# Patient Record
Sex: Female | Born: 1980 | Race: Black or African American | Hispanic: No | Marital: Single | Smoking: Never smoker
Health system: Southern US, Community
[De-identification: ages and names within clinical notes are randomized; demographics above are authoritative.]

---

## 2014-01-21 ENCOUNTER — Encounter (HOSPITAL_COMMUNITY): Payer: Self-pay | Admitting: Emergency Medicine

## 2014-01-21 DIAGNOSIS — T4275XA Adverse effect of unspecified antiepileptic and sedative-hypnotic drugs, initial encounter: Secondary | ICD-10-CM | POA: Diagnosis not present

## 2014-01-21 DIAGNOSIS — K8000 Calculus of gallbladder with acute cholecystitis without obstruction: Principal | ICD-10-CM | POA: Diagnosis present

## 2014-01-21 DIAGNOSIS — K8020 Calculus of gallbladder without cholecystitis without obstruction: Secondary | ICD-10-CM | POA: Diagnosis not present

## 2014-01-21 DIAGNOSIS — K8010 Calculus of gallbladder with chronic cholecystitis without obstruction: Secondary | ICD-10-CM | POA: Diagnosis present

## 2014-01-21 NOTE — ED Notes (Signed)
Pt. reports mid abdominal pain onset this evening , denies nausea or vomitting , no fever or chills, denies urinary discomfort or vaginal discharge .

## 2014-01-22 ENCOUNTER — Inpatient Hospital Stay (HOSPITAL_COMMUNITY): Payer: BC Managed Care – PPO | Admitting: Anesthesiology

## 2014-01-22 ENCOUNTER — Encounter (HOSPITAL_COMMUNITY): Payer: BC Managed Care – PPO | Admitting: Anesthesiology

## 2014-01-22 ENCOUNTER — Encounter (HOSPITAL_COMMUNITY): Payer: Self-pay | Admitting: *Deleted

## 2014-01-22 ENCOUNTER — Inpatient Hospital Stay (HOSPITAL_COMMUNITY): Payer: BC Managed Care – PPO

## 2014-01-22 ENCOUNTER — Inpatient Hospital Stay (HOSPITAL_COMMUNITY)
Admission: EM | Admit: 2014-01-22 | Discharge: 2014-01-23 | DRG: 419 | Disposition: A | Discharge status: Home / Self Care | Payer: BC Managed Care – PPO | Attending: General Surgery | Admitting: General Surgery

## 2014-01-22 ENCOUNTER — Emergency Department (HOSPITAL_COMMUNITY): Payer: BC Managed Care – PPO

## 2014-01-22 ENCOUNTER — Encounter (HOSPITAL_COMMUNITY): Admission: EM | Disposition: A | Discharge status: Home / Self Care | Payer: Self-pay | Source: Home / Self Care

## 2014-01-22 DIAGNOSIS — K8000 Calculus of gallbladder with acute cholecystitis without obstruction: Secondary | ICD-10-CM | POA: Diagnosis present

## 2014-01-22 DIAGNOSIS — K8020 Calculus of gallbladder without cholecystitis without obstruction: Secondary | ICD-10-CM | POA: Diagnosis present

## 2014-01-22 DIAGNOSIS — K8010 Calculus of gallbladder with chronic cholecystitis without obstruction: Secondary | ICD-10-CM | POA: Diagnosis present

## 2014-01-22 DIAGNOSIS — T4275XA Adverse effect of unspecified antiepileptic and sedative-hypnotic drugs, initial encounter: Secondary | ICD-10-CM | POA: Diagnosis not present

## 2014-01-22 HISTORY — PX: CHOLECYSTECTOMY: SHX55

## 2014-01-22 LAB — CBC WITH DIFFERENTIAL/PLATELET
Basophils Absolute: 0 10*3/uL (ref 0.0–0.1)
Basophils Relative: 0 % (ref 0–1)
EOS ABS: 0.1 10*3/uL (ref 0.0–0.7)
Eosinophils Relative: 1 % (ref 0–5)
HCT: 37.1 % (ref 36.0–46.0)
HEMOGLOBIN: 12.5 g/dL (ref 12.0–15.0)
Lymphocytes Relative: 34 % (ref 12–46)
Lymphs Abs: 2.6 10*3/uL (ref 0.7–4.0)
MCH: 26.2 pg (ref 26.0–34.0)
MCHC: 33.7 g/dL (ref 30.0–36.0)
MCV: 77.8 fL — ABNORMAL LOW (ref 78.0–100.0)
MONOS PCT: 7 % (ref 3–12)
Monocytes Absolute: 0.5 10*3/uL (ref 0.1–1.0)
NEUTROS ABS: 4.6 10*3/uL (ref 1.7–7.7)
NEUTROS PCT: 58 % (ref 43–77)
Platelets: 295 10*3/uL (ref 150–400)
RBC: 4.77 MIL/uL (ref 3.87–5.11)
RDW: 13.7 % (ref 11.5–15.5)
WBC: 7.8 10*3/uL (ref 4.0–10.5)

## 2014-01-22 LAB — COMPREHENSIVE METABOLIC PANEL
ALK PHOS: 66 U/L (ref 39–117)
ALT: 10 U/L (ref 0–35)
AST: 16 U/L (ref 0–37)
Albumin: 4.2 g/dL (ref 3.5–5.2)
Anion gap: 12 (ref 5–15)
BILIRUBIN TOTAL: 0.3 mg/dL (ref 0.3–1.2)
BUN: 9 mg/dL (ref 6–23)
CHLORIDE: 103 meq/L (ref 96–112)
CO2: 24 mEq/L (ref 19–32)
Calcium: 9.6 mg/dL (ref 8.4–10.5)
Creatinine, Ser: 0.69 mg/dL (ref 0.50–1.10)
GLUCOSE: 99 mg/dL (ref 70–99)
POTASSIUM: 3.9 meq/L (ref 3.7–5.3)
Sodium: 139 mEq/L (ref 137–147)
Total Protein: 8.1 g/dL (ref 6.0–8.3)

## 2014-01-22 LAB — URINALYSIS, ROUTINE W REFLEX MICROSCOPIC
Bilirubin Urine: NEGATIVE
GLUCOSE, UA: NEGATIVE mg/dL
HGB URINE DIPSTICK: NEGATIVE
KETONES UR: NEGATIVE mg/dL
Leukocytes, UA: NEGATIVE
Nitrite: NEGATIVE
Protein, ur: NEGATIVE mg/dL
Specific Gravity, Urine: 1.012 (ref 1.005–1.030)
Urobilinogen, UA: 0.2 mg/dL (ref 0.0–1.0)
pH: 7.5 (ref 5.0–8.0)

## 2014-01-22 LAB — SURGICAL PCR SCREEN
MRSA, PCR: NEGATIVE
Staphylococcus aureus: NEGATIVE

## 2014-01-22 LAB — PREGNANCY, URINE: Preg Test, Ur: NEGATIVE

## 2014-01-22 LAB — LIPASE, BLOOD: Lipase: 41 U/L (ref 11–59)

## 2014-01-22 SURGERY — LAPAROSCOPIC CHOLECYSTECTOMY WITH INTRAOPERATIVE CHOLANGIOGRAM
Anesthesia: General

## 2014-01-22 SURGERY — LAPAROSCOPIC CHOLECYSTECTOMY WITH INTRAOPERATIVE CHOLANGIOGRAM
Anesthesia: General | Site: Abdomen

## 2014-01-22 MED ORDER — GLYCOPYRROLATE 0.2 MG/ML IJ SOLN
INTRAMUSCULAR | Status: DC | PRN
  Administered 2014-01-22: 0.6 mg via INTRAVENOUS

## 2014-01-22 MED ORDER — CEFAZOLIN SODIUM-DEXTROSE 2-3 GM-% IV SOLR
2.0000 g | INTRAVENOUS | Status: AC
  Administered 2014-01-23: 2 g via INTRAVENOUS
  Filled 2014-01-22: qty 50

## 2014-01-22 MED ORDER — LACTATED RINGERS IV SOLN
INTRAVENOUS | Status: DC | PRN
  Administered 2014-01-22 (×2): via INTRAVENOUS

## 2014-01-22 MED ORDER — LIDOCAINE HCL (CARDIAC) 20 MG/ML IV SOLN
INTRAVENOUS | Status: AC
  Filled 2014-01-22: qty 5

## 2014-01-22 MED ORDER — FENTANYL CITRATE 0.05 MG/ML IJ SOLN
INTRAMUSCULAR | Status: AC
  Filled 2014-01-22: qty 5

## 2014-01-22 MED ORDER — ROCURONIUM BROMIDE 100 MG/10ML IV SOLN
INTRAVENOUS | Status: DC | PRN
  Administered 2014-01-22: 25 mg via INTRAVENOUS

## 2014-01-22 MED ORDER — HYDROMORPHONE HCL 1 MG/ML IJ SOLN
0.2500 mg | INTRAMUSCULAR | Status: DC | PRN
  Administered 2014-01-22 (×2): 0.5 mg via INTRAVENOUS

## 2014-01-22 MED ORDER — PROPOFOL 10 MG/ML IV BOLUS
INTRAVENOUS | Status: AC
  Filled 2014-01-22: qty 20

## 2014-01-22 MED ORDER — EPHEDRINE SULFATE 50 MG/ML IJ SOLN
INTRAMUSCULAR | Status: AC
Start: 2014-01-22 — End: 2014-01-22
  Filled 2014-01-22: qty 1

## 2014-01-22 MED ORDER — 0.9 % SODIUM CHLORIDE (POUR BTL) OPTIME
TOPICAL | Status: DC | PRN
  Administered 2014-01-22: 1000 mL

## 2014-01-22 MED ORDER — ONDANSETRON HCL 4 MG/2ML IJ SOLN
INTRAMUSCULAR | Status: AC
  Filled 2014-01-22: qty 2

## 2014-01-22 MED ORDER — OXYCODONE-ACETAMINOPHEN 5-325 MG PO TABS
1.0000 | ORAL_TABLET | ORAL | Status: DC | PRN
  Administered 2014-01-22 – 2014-01-23 (×2): 1 via ORAL
  Filled 2014-01-22 (×2): qty 1
  Filled 2014-01-22: qty 2

## 2014-01-22 MED ORDER — DEXTROSE 5 % IV SOLN
2.0000 g | INTRAVENOUS | Status: AC
  Administered 2014-01-22: 2 g via INTRAVENOUS
  Filled 2014-01-22: qty 2

## 2014-01-22 MED ORDER — ONDANSETRON HCL 4 MG/2ML IJ SOLN
4.0000 mg | Freq: Once | INTRAMUSCULAR | Status: AC
  Administered 2014-01-22: 4 mg via INTRAVENOUS
  Filled 2014-01-22: qty 2

## 2014-01-22 MED ORDER — OXYCODONE-ACETAMINOPHEN 5-325 MG PO TABS
ORAL_TABLET | ORAL | Status: AC
  Administered 2014-01-22: 2
  Filled 2014-01-22: qty 2

## 2014-01-22 MED ORDER — DEXAMETHASONE SODIUM PHOSPHATE 4 MG/ML IJ SOLN
INTRAMUSCULAR | Status: DC | PRN
  Administered 2014-01-22: 8 mg via INTRAVENOUS

## 2014-01-22 MED ORDER — SODIUM CHLORIDE 0.9 % IR SOLN
Status: DC | PRN
  Administered 2014-01-22: 1000 mL

## 2014-01-22 MED ORDER — NEOSTIGMINE METHYLSULFATE 10 MG/10ML IV SOLN
INTRAVENOUS | Status: AC
  Filled 2014-01-22: qty 1

## 2014-01-22 MED ORDER — MIDAZOLAM HCL 2 MG/2ML IJ SOLN
INTRAMUSCULAR | Status: AC
  Filled 2014-01-22: qty 2

## 2014-01-22 MED ORDER — HYDROMORPHONE HCL 1 MG/ML IJ SOLN
1.0000 mg | INTRAMUSCULAR | Status: DC | PRN
  Administered 2014-01-22: 1 mg via INTRAVENOUS
  Filled 2014-01-22: qty 1

## 2014-01-22 MED ORDER — DEXTROSE IN LACTATED RINGERS 5 % IV SOLN
INTRAVENOUS | Status: DC
  Administered 2014-01-23: via INTRAVENOUS

## 2014-01-22 MED ORDER — BUPIVACAINE-EPINEPHRINE (PF) 0.25% -1:200000 IJ SOLN
INTRAMUSCULAR | Status: AC
  Filled 2014-01-22: qty 30

## 2014-01-22 MED ORDER — LIDOCAINE HCL (CARDIAC) 20 MG/ML IV SOLN
INTRAVENOUS | Status: DC | PRN
  Administered 2014-01-22: 40 mg via INTRAVENOUS

## 2014-01-22 MED ORDER — LACTATED RINGERS IV SOLN
INTRAVENOUS | Status: AC
  Administered 2014-01-22: 04:00:00 via INTRAVENOUS

## 2014-01-22 MED ORDER — ONDANSETRON HCL 4 MG/2ML IJ SOLN: 4.0000 mg | Freq: Four times a day (QID) | INTRAMUSCULAR | Status: DC | PRN

## 2014-01-22 MED ORDER — MIDAZOLAM HCL 5 MG/5ML IJ SOLN
INTRAMUSCULAR | Status: DC | PRN
  Administered 2014-01-22: 2 mg via INTRAVENOUS

## 2014-01-22 MED ORDER — ROCURONIUM BROMIDE 50 MG/5ML IV SOLN
INTRAVENOUS | Status: AC
Start: 2014-01-22 — End: 2014-01-22
  Filled 2014-01-22: qty 1

## 2014-01-22 MED ORDER — FENTANYL CITRATE 0.05 MG/ML IJ SOLN
INTRAMUSCULAR | Status: DC | PRN
  Administered 2014-01-22 (×2): 100 ug via INTRAVENOUS
  Administered 2014-01-22: 50 ug via INTRAVENOUS

## 2014-01-22 MED ORDER — HYDROMORPHONE HCL 1 MG/ML IJ SOLN
INTRAMUSCULAR | Status: AC
  Administered 2014-01-22: 0.5 mg via INTRAVENOUS
  Filled 2014-01-22: qty 1

## 2014-01-22 MED ORDER — CHLORHEXIDINE GLUCONATE 0.12 % MT SOLN
15.0000 mL | Freq: Two times a day (BID) | OROMUCOSAL | Status: DC
  Administered 2014-01-22 – 2014-01-23 (×3): 15 mL via OROMUCOSAL
  Filled 2014-01-22 (×3): qty 15

## 2014-01-22 MED ORDER — ONDANSETRON HCL 4 MG/2ML IJ SOLN
4.0000 mg | INTRAMUSCULAR | Status: AC
  Administered 2014-01-22: 4 mg via INTRAVENOUS
  Filled 2014-01-22: qty 2

## 2014-01-22 MED ORDER — HYDROMORPHONE HCL 1 MG/ML IJ SOLN
1.0000 mg | Freq: Once | INTRAMUSCULAR | Status: AC
  Administered 2014-01-22: 1 mg via INTRAVENOUS
  Filled 2014-01-22: qty 1

## 2014-01-22 MED ORDER — MORPHINE SULFATE 4 MG/ML IJ SOLN
4.0000 mg | Freq: Once | INTRAMUSCULAR | Status: AC
  Administered 2014-01-22: 4 mg via INTRAVENOUS
  Filled 2014-01-22: qty 1

## 2014-01-22 MED ORDER — GLYCOPYRROLATE 0.2 MG/ML IJ SOLN
INTRAMUSCULAR | Status: AC
  Filled 2014-01-22: qty 2

## 2014-01-22 MED ORDER — SUCCINYLCHOLINE CHLORIDE 20 MG/ML IJ SOLN
INTRAMUSCULAR | Status: DC | PRN
  Administered 2014-01-22: 100 mg via INTRAVENOUS

## 2014-01-22 MED ORDER — DIPHENHYDRAMINE HCL 50 MG/ML IJ SOLN
INTRAMUSCULAR | Status: DC | PRN
  Administered 2014-01-22: 12.5 mg via INTRAVENOUS

## 2014-01-22 MED ORDER — ONDANSETRON HCL 4 MG/2ML IJ SOLN
INTRAMUSCULAR | Status: DC | PRN
  Administered 2014-01-22: 4 mg via INTRAVENOUS

## 2014-01-22 MED ORDER — SODIUM CHLORIDE 0.9 % IV SOLN
INTRAVENOUS | Status: DC | PRN
  Administered 2014-01-22: 12:00:00

## 2014-01-22 MED ORDER — CETYLPYRIDINIUM CHLORIDE 0.05 % MT LIQD
7.0000 mL | Freq: Two times a day (BID) | OROMUCOSAL | Status: DC
  Administered 2014-01-22: 7 mL via OROMUCOSAL

## 2014-01-22 MED ORDER — BUPIVACAINE-EPINEPHRINE 0.25% -1:200000 IJ SOLN
INTRAMUSCULAR | Status: DC | PRN
  Administered 2014-01-22: 20 mL

## 2014-01-22 MED ORDER — NEOSTIGMINE METHYLSULFATE 10 MG/10ML IV SOLN
INTRAVENOUS | Status: DC | PRN
  Administered 2014-01-22: 4 mg via INTRAVENOUS

## 2014-01-22 MED ORDER — HYDROMORPHONE HCL 1 MG/ML IJ SOLN: 1.0000 mg | INTRAMUSCULAR | Status: DC | PRN

## 2014-01-22 MED ORDER — SODIUM CHLORIDE 0.9 % IJ SOLN
INTRAMUSCULAR | Status: AC
Start: 2014-01-22 — End: 2014-01-22
  Filled 2014-01-22: qty 10

## 2014-01-22 MED ORDER — PROPOFOL 10 MG/ML IV BOLUS
INTRAVENOUS | Status: DC | PRN
  Administered 2014-01-22: 180 mg via INTRAVENOUS

## 2014-01-22 SURGICAL SUPPLY — 39 items
APPLIER CLIP ROT 10 11.4 M/L (STAPLE) ×3
BLADE SURG ROTATE 9660 (MISCELLANEOUS) IMPLANT
CANISTER SUCTION 2500CC (MISCELLANEOUS) ×3 IMPLANT
CHLORAPREP W/TINT 26ML (MISCELLANEOUS) ×3 IMPLANT
CLIP APPLIE ROT 10 11.4 M/L (STAPLE) ×1 IMPLANT
COVER MAYO STAND STRL (DRAPES) ×3 IMPLANT
COVER SURGICAL LIGHT HANDLE (MISCELLANEOUS) ×3 IMPLANT
DECANTER SPIKE VIAL GLASS SM (MISCELLANEOUS) ×6 IMPLANT
DERMABOND ADVANCED (GAUZE/BANDAGES/DRESSINGS) ×2
DERMABOND ADVANCED .7 DNX12 (GAUZE/BANDAGES/DRESSINGS) ×1 IMPLANT
DRAPE C-ARM 42X72 X-RAY (DRAPES) ×3 IMPLANT
DRAPE UTILITY 15X26 W/TAPE STR (DRAPE) ×6 IMPLANT
DRAPE WARM FLUID 44X44 (DRAPE) ×3 IMPLANT
ELECT REM PT RETURN 9FT ADLT (ELECTROSURGICAL) ×3
ELECTRODE REM PT RTRN 9FT ADLT (ELECTROSURGICAL) ×1 IMPLANT
GLOVE BIO SURGEON STRL SZ8 (GLOVE) ×3 IMPLANT
GLOVE BIOGEL PI IND STRL 8 (GLOVE) ×1 IMPLANT
GLOVE BIOGEL PI INDICATOR 8 (GLOVE) ×2
GOWN STRL REUS W/ TWL LRG LVL3 (GOWN DISPOSABLE) ×4 IMPLANT
GOWN STRL REUS W/ TWL XL LVL3 (GOWN DISPOSABLE) ×1 IMPLANT
GOWN STRL REUS W/TWL LRG LVL3 (GOWN DISPOSABLE) ×8
GOWN STRL REUS W/TWL XL LVL3 (GOWN DISPOSABLE) ×2
KIT BASIN OR (CUSTOM PROCEDURE TRAY) ×3 IMPLANT
KIT ROOM TURNOVER OR (KITS) ×3 IMPLANT
NS IRRIG 1000ML POUR BTL (IV SOLUTION) ×6 IMPLANT
PAD ARMBOARD 7.5X6 YLW CONV (MISCELLANEOUS) ×3 IMPLANT
POUCH SPECIMEN RETRIEVAL 10MM (ENDOMECHANICALS) ×3 IMPLANT
SCISSORS LAP 5X35 DISP (ENDOMECHANICALS) ×3 IMPLANT
SET CHOLANGIOGRAPH 5 50 .035 (SET/KITS/TRAYS/PACK) ×3 IMPLANT
SET IRRIG TUBING LAPAROSCOPIC (IRRIGATION / IRRIGATOR) ×3 IMPLANT
SLEEVE ENDOPATH XCEL 5M (ENDOMECHANICALS) ×3 IMPLANT
SPECIMEN JAR SMALL (MISCELLANEOUS) ×3 IMPLANT
SUT MNCRL AB 4-0 PS2 18 (SUTURE) ×3 IMPLANT
TOWEL OR 17X24 6PK STRL BLUE (TOWEL DISPOSABLE) ×3 IMPLANT
TOWEL OR 17X26 10 PK STRL BLUE (TOWEL DISPOSABLE) ×3 IMPLANT
TRAY LAPAROSCOPIC (CUSTOM PROCEDURE TRAY) ×3 IMPLANT
TROCAR XCEL BLUNT TIP 100MML (ENDOMECHANICALS) ×3 IMPLANT
TROCAR XCEL NON-BLD 11X100MML (ENDOMECHANICALS) ×3 IMPLANT
TROCAR XCEL NON-BLD 5MMX100MML (ENDOMECHANICALS) ×3 IMPLANT

## 2014-01-22 NOTE — Progress Notes (Signed)
  Subjective: Pt a little better  Objective: Vital signs in last 24 hours: Temp:  [97.7 F (36.5 C)-98.9 F (37.2 C)] 97.7 F (36.5 C) (09/19 0635) Pulse Rate:  [89-121] 100 (09/19 0635) Resp:  [16-26] 16 (09/19 0635) BP: (129-155)/(75-107) 129/75 mmHg (09/19 0635) SpO2:  [97 %-100 %] 100 % (09/19 0635) Weight:  [189 lb 9.5 oz (86 kg)] 189 lb 9.5 oz (86 kg) (09/19 0355) Last BM Date: 01/21/14  Intake/Output from previous day:   Intake/Output this shift:    GI: abnormal findings:  tender RUQ  Lab Results:   Recent Labs  01/22/14 0001  WBC 7.8  HGB 12.5  HCT 37.1  PLT 295   BMET  Recent Labs  01/22/14 0001  NA 139  K 3.9  CL 103  CO2 24  GLUCOSE 99  BUN 9  CREATININE 0.69  CALCIUM 9.6   PT/INR No results found for this basename: LABPROT, INR,  in the last 72 hours ABG No results found for this basename: PHART, PCO2, PO2, HCO3,  in the last 72 hours  Studies/Results: US Abdomen Limited  01/22/2014   CLINICAL DATA:  RIGHT upper quadrant pain.  EXAM: US ABDOMEN LIMITED - RIGHT UPPER QUADRANT  COMPARISON:  None.  FINDINGS: Gallbladder:  Multiple tiny echogenic gallstones versus sludge with solitary 2.3 cm gallstone with acoustic shadowing . Mild gallbladder distention without wall thickening or pericholecystic fluid. Reported sonographic Murphy's sign was elicited.  Common bile duct:  Diameter: 4 mm  Liver:  No focal lesion identified. Within normal limits in parenchymal echogenicity.  IMPRESSION: Cholelithiasis/sludge and reported sonographic Murphy's sign without corroborative findings of acute cholecystitis.   Electronically Signed   By: Awilda Metro   On: 01/22/2014 01:56    Anti-infectives: Anti-infectives   Start     Dose/Rate Route Frequency Ordered Stop   01/22/14 0600  cefoTEtan (CEFOTAN) 2 g in dextrose 5 % 50 mL IVPB     2 g 100 mL/hr over 30 Minutes Intravenous On call to O.R. 01/22/14 1610 01/23/14 0559      Assessment/Plan: acute  cholecystitis s/p Procedure(s): LAPAROSCOPIC CHOLECYSTECTOMY WITH INTRAOPERATIVE CHOLANGIOGRAM (N/A) The procedure has been discussed with the patient. Operative and non operative treatments have been discussed. Risks of surgery include bleeding, infection,  Common bile duct injury,  Injury to the stomach,liver, colon,small intestine, abdominal wall,  Diaphragm,  Major blood vessels,  And the need for an open procedure.  Other risks include worsening of medical problems, death,  DVT and pulmonary embolism, and cardiovascular events.   Medical options have also been discussed. The patient has been informed of long term expectations of surgery and non surgical options,  The patient agrees to proceed.    LOS: 0 days    Danaiya Steadman A. 01/22/2014

## 2014-01-22 NOTE — H&P (Signed)
Jillian Price is an 33 y.o. female.   Chief Complaint: Abdominal pain with nausea and vomiting  HPI: Patient has been having pain since 9:00 pm yesterday when she developed severe RUQ and epigastric abdominal pan associated with nausea and vomiting.  No jaundice.  BM normal.  Came to ED where US demonstrated a large single stone obstructing the cystic duct in the neck of the GB.  Surgery was called.  History reviewed. No pertinent past medical history.  History reviewed. No pertinent past surgical history.  No family history on file. Social History:  reports that she has never smoked. She does not have any smokeless tobacco history on file. She reports that she does not drink alcohol or use illicit drugs.  Allergies: No Known Allergies  No prescriptions prior to admission    Results for orders placed during the hospital encounter of 01/22/14 (from the past 48 hour(s))  CBC WITH DIFFERENTIAL     Status: Abnormal   Collection Time    01/22/14 12:01 AM      Result Value Ref Range   WBC 7.8  4.0 - 10.5 K/uL   RBC 4.77  3.87 - 5.11 MIL/uL   Hemoglobin 12.5  12.0 - 15.0 g/dL   HCT 37.1  36.0 - 46.0 %   MCV 77.8 (*) 78.0 - 100.0 fL   MCH 26.2  26.0 - 34.0 pg   MCHC 33.7  30.0 - 36.0 g/dL   RDW 13.7  11.5 - 15.5 %   Platelets 295  150 - 400 K/uL   Neutrophils Relative % 58  43 - 77 %   Neutro Abs 4.6  1.7 - 7.7 K/uL   Lymphocytes Relative 34  12 - 46 %   Lymphs Abs 2.6  0.7 - 4.0 K/uL   Monocytes Relative 7  3 - 12 %   Monocytes Absolute 0.5  0.1 - 1.0 K/uL   Eosinophils Relative 1  0 - 5 %   Eosinophils Absolute 0.1  0.0 - 0.7 K/uL   Basophils Relative 0  0 - 1 %   Basophils Absolute 0.0  0.0 - 0.1 K/uL  COMPREHENSIVE METABOLIC PANEL     Status: None   Collection Time    01/22/14 12:01 AM      Result Value Ref Range   Sodium 139  137 - 147 mEq/L   Potassium 3.9  3.7 - 5.3 mEq/L   Chloride 103  96 - 112 mEq/L   CO2 24  19 - 32 mEq/L   Glucose, Bld 99  70 - 99 mg/dL   BUN  9  6 - 23 mg/dL   Creatinine, Ser 0.69  0.50 - 1.10 mg/dL   Calcium 9.6  8.4 - 10.5 mg/dL   Total Protein 8.1  6.0 - 8.3 g/dL   Albumin 4.2  3.5 - 5.2 g/dL   AST 16  0 - 37 U/L   ALT 10  0 - 35 U/L   Alkaline Phosphatase 66  39 - 117 U/L   Total Bilirubin 0.3  0.3 - 1.2 mg/dL   GFR calc non Af Amer >90  >90 mL/min   GFR calc Af Amer >90  >90 mL/min   Comment: (NOTE)     The eGFR has been calculated using the CKD EPI equation.     This calculation has not been validated in all clinical situations.     eGFR's persistently <90 mL/min signify possible Chronic Kidney     Disease.   Anion  gap 12  5 - 15  LIPASE, BLOOD     Status: None   Collection Time    01/22/14 12:01 AM      Result Value Ref Range   Lipase 41  11 - 59 U/L  PREGNANCY, URINE     Status: None   Collection Time    01/22/14  2:07 AM      Result Value Ref Range   Preg Test, Ur NEGATIVE  NEGATIVE   Comment:            THE SENSITIVITY OF THIS     METHODOLOGY IS >20 mIU/mL.  URINALYSIS, ROUTINE W REFLEX MICROSCOPIC     Status: Abnormal   Collection Time    01/22/14  2:07 AM      Result Value Ref Range   Color, Urine STRAW (*) YELLOW   APPearance CLEAR  CLEAR   Specific Gravity, Urine 1.012  1.005 - 1.030   pH 7.5  5.0 - 8.0   Glucose, UA NEGATIVE  NEGATIVE mg/dL   Hgb urine dipstick NEGATIVE  NEGATIVE   Bilirubin Urine NEGATIVE  NEGATIVE   Ketones, ur NEGATIVE  NEGATIVE mg/dL   Protein, ur NEGATIVE  NEGATIVE mg/dL   Urobilinogen, UA 0.2  0.0 - 1.0 mg/dL   Nitrite NEGATIVE  NEGATIVE   Leukocytes, UA NEGATIVE  NEGATIVE   Comment: MICROSCOPIC NOT DONE ON URINES WITH NEGATIVE PROTEIN, BLOOD, LEUKOCYTES, NITRITE, OR GLUCOSE <1000 mg/dL.   US Abdomen Limited  01/22/2014   CLINICAL DATA:  RIGHT upper quadrant pain.  EXAM: US ABDOMEN LIMITED - RIGHT UPPER QUADRANT  COMPARISON:  None.  FINDINGS: Gallbladder:  Multiple tiny echogenic gallstones versus sludge with solitary 2.3 cm gallstone with acoustic shadowing . Mild  gallbladder distention without wall thickening or pericholecystic fluid. Reported sonographic Murphy's sign was elicited.  Common bile duct:  Diameter: 4 mm  Liver:  No focal lesion identified. Within normal limits in parenchymal echogenicity.  IMPRESSION: Cholelithiasis/sludge and reported sonographic Murphy's sign without corroborative findings of acute cholecystitis.   Electronically Signed   By: Elon Alas   On: 01/22/2014 01:56    Review of Systems  Constitutional: Negative for fever and chills.  HENT: Negative.   Eyes: Negative.   Respiratory: Negative.   Cardiovascular: Negative.   Gastrointestinal: Positive for nausea, vomiting and abdominal pain.  Genitourinary: Negative.   Musculoskeletal: Positive for back pain.  Skin: Negative.   Neurological: Negative.   Endo/Heme/Allergies: Negative.   Psychiatric/Behavioral: Negative.     Blood pressure 140/95, pulse 89, temperature 97.8 F (36.6 C), temperature source Oral, resp. rate 20, height _0  (1.549 m), weight 86 kg (189 lb 9.5 oz), last menstrual period 01/06/2014, SpO2 100.00%. Physical Exam  Constitutional: She is oriented to person, place, and time. She appears well-developed and well-nourished.  Moderately obese  HENT:  Head: Normocephalic and atraumatic.  Eyes: Conjunctivae and EOM are normal. Pupils are equal, round, and reactive to light.  Neck: Normal range of motion. Neck supple.  Cardiovascular: Normal rate, regular rhythm and normal heart sounds.   Respiratory: Effort normal and breath sounds normal.  GI: Soft. Normal appearance and bowel sounds are normal. There is tenderness in the right upper quadrant. There is positive Murphy's sign.  Musculoskeletal: Normal range of motion.  Neurological: She is alert and oriented to person, place, and time.  Skin: Skin is warm and dry.  Psychiatric: She has a normal mood and affect. Her behavior is normal. Judgment normal.  Assessment/Plan Symptomatic  cholelithiasis with possible cholecystitis  Should have GB removed laparoscopically today. No prior abdominals surgery.  Kella Splinter, JAY 01/22/2014, 4:18 AM

## 2014-01-22 NOTE — Transfer of Care (Signed)
Immediate Anesthesia Transfer of Care Note  Patient: Jillian Price  Procedure(s) Performed: Procedure(s): LAPAROSCOPIC CHOLECYSTECTOMY WITH INTRAOPERATIVE CHOLANGIOGRAM (N/A)  Patient Location: PACU  Anesthesia Type:General  Level of Consciousness: sedated  Airway & Oxygen Therapy: Patient Spontanous Breathing  Post-op Assessment: Report given to PACU RN and Post -op Vital signs reviewed and stable  Post vital signs: Reviewed and stable  Complications: No apparent anesthesia complications

## 2014-01-22 NOTE — Anesthesia Postprocedure Evaluation (Signed)
  Anesthesia Post-op Note  Patient: Jillian Price  Procedure(s) Performed: Procedure(s): LAPAROSCOPIC CHOLECYSTECTOMY WITH INTRAOPERATIVE CHOLANGIOGRAM (N/A)  Patient Location: PACU  Anesthesia Type:General  Level of Consciousness: awake  Airway and Oxygen Therapy: Patient Spontanous Breathing  Post-op Pain: mild  Post-op Assessment: Post-op Vital signs reviewed  Post-op Vital Signs: Reviewed  Last Vitals:  Filed Vitals:   01/22/14 1402  BP: 128/74  Pulse: 92  Temp: 36.7 C  Resp: 14    Complications: No apparent anesthesia complications

## 2014-01-22 NOTE — ED Notes (Addendum)
Pt vomited x 1. Nanavati made aware.

## 2014-01-22 NOTE — Anesthesia Procedure Notes (Signed)
Procedure Name: Intubation Date/Time: 01/22/2014 11:30 AM Performed by: Brien Mates D Pre-anesthesia Checklist: Emergency Drugs available, Patient identified, Suction available, Patient being monitored and Timeout performed Patient Re-evaluated:Patient Re-evaluated prior to inductionOxygen Delivery Method: Circle system utilized Preoxygenation: Pre-oxygenation with 100% oxygen Intubation Type: IV induction, Rapid sequence and Cricoid Pressure applied Laryngoscope Size: Miller and 2 Grade View: Grade I Tube type: Oral Tube size: 7.5 mm Number of attempts: 1 Airway Equipment and Method: Stylet Placement Confirmation: ETT inserted through vocal cords under direct vision,  positive ETCO2 and breath sounds checked- equal and bilateral Secured at: 22 cm Tube secured with: Tape Dental Injury: Teeth and Oropharynx as per pre-operative assessment

## 2014-01-22 NOTE — ED Provider Notes (Signed)
CSN: [id number]     Arrival date & time 01/21/14  2352 History   First MD Initiated Contact with Patient 01/22/14 0104     Chief Complaint  Patient presents with  . Abdominal Pain     (Consider location/radiation/quality/duration/timing/severity/associated sxs/prior Treatment) Patient is a 33 y.o. female presenting with abdominal pain. The history is provided by the patient.  Abdominal Pain Pain location:  Epigastric and RUQ Pain quality: sharp   Pain radiates to:  Does not radiate Pain severity:  Severe Onset quality:  Sudden Duration:  4 hours Timing:  Constant Progression:  Worsening Chronicity:  New Context: eating   Context: not alcohol use, not previous surgeries, not suspicious food intake and not trauma   Worsened by:  Nothing tried Associated symptoms: anorexia, nausea and vomiting   Associated symptoms: no chest pain, no chills, no cough, no diarrhea, no dysuria, no hematuria, no vaginal bleeding and no vaginal discharge     History reviewed. No pertinent past medical history. History reviewed. No pertinent past surgical history. No family history on file. History  Substance Use Topics  . Smoking status: Never Smoker   . Smokeless tobacco: Not on file  . Alcohol Use: No   OB History   Grav Para Term Preterm Abortions TAB SAB Ect Mult Living                 Review of Systems  Constitutional: Negative for chills.  Respiratory: Negative for cough.   Cardiovascular: Negative for chest pain.  Gastrointestinal: Positive for nausea, vomiting, abdominal pain and anorexia. Negative for diarrhea.  Genitourinary: Negative for dysuria, hematuria, vaginal bleeding and vaginal discharge.  Skin: Negative for color change and rash.  Allergic/Immunologic: Negative for immunocompromised state.  Neurological: Negative for headaches.  Hematological: Does not bruise/bleed easily.  Psychiatric/Behavioral: Negative for confusion.      Allergies  Review of patient's  allergies indicates no known allergies.  Home Medications   Prior to Admission medications   Not on File   BP 150/98  Pulse 102  Temp(Src) 98.9 F (37.2 C) (Oral)  Resp 21  Ht  (1.549 m)  SpO2 100%  LMP 01/06/2014 Physical Exam  Nursing note and vitals reviewed. Constitutional: She is oriented to person, place, and time. She appears well-developed.  HENT:  Head: Atraumatic.  Eyes: Conjunctivae are normal.  Neck: Neck supple.  Cardiovascular: Normal rate and regular rhythm.   Pulmonary/Chest: Effort normal.  Abdominal: Soft. Bowel sounds are normal. There is tenderness. There is guarding. There is no rebound.  RUQ tenderness, + Murphy's  Neurological: She is alert and oriented to person, place, and time.  Skin: Skin is warm.    ED Course  Procedures (including critical care time) Labs Review Labs Reviewed  CBC WITH DIFFERENTIAL - Abnormal; Notable for the following:    MCV 77.8 (*)    All other components within normal limits  URINALYSIS, ROUTINE W REFLEX MICROSCOPIC - Abnormal; Notable for the following:    Color, Urine STRAW (*)    All other components within normal limits  COMPREHENSIVE METABOLIC PANEL  LIPASE, BLOOD  PREGNANCY, URINE    Imaging Review US Abdomen Limited  01/22/2014   CLINICAL DATA:  RIGHT upper quadrant pain.  EXAM: US ABDOMEN LIMITED - RIGHT UPPER QUADRANT  COMPARISON:  None.  FINDINGS: Gallbladder:  Multiple tiny echogenic gallstones versus sludge with solitary 2.3 cm gallstone with acoustic shadowing . Mild gallbladder distention without wall thickening or pericholecystic fluid. Reported sonographic Murphy's sign was  elicited.  Common bile duct:  Diameter: 4 mm  Liver:  No focal lesion identified. Within normal limits in parenchymal echogenicity.  IMPRESSION: Cholelithiasis/sludge and reported sonographic Murphy's sign without corroborative findings of acute cholecystitis.   Electronically Signed   By: Awilda Metro   On: 01/22/2014 01:56      EKG Interpretation None      MDM   Final diagnoses:  Symptomatic cholelithiasis    DDx includes: Pancreatitis Hepatobiliary pathology including cholecystitis Gastritis/PUD SBO ACS syndrome  Pt comes in with sudden onset RUQ and epigastric pain. Pt has no hx of similar pain, no lower quadrant pain, no pelvic disorders, no uti like sx. Pt's US shows cholelithisis, and sludge, sonographic murphy's. Pt is s/p 2 rounds of 4 mg morphine, still in pain. Spoke with Dr. Lindie Spruce, for concerns with symptomatic cholelithiasis, and he would like for [place] to admit patient to med surg. Standing orders placed. 0 SIRS criteria. Pt aware of the dx.   Derwood Kaplan, MD 01/22/14 [phone]

## 2014-01-22 NOTE — Op Note (Signed)
Laparoscopic Cholecystectomy with IOC Procedure Note  Indications: This patient presents with symptomatic gallbladder disease and will undergo laparoscopic cholecystectomy.The procedure has been discussed with the patient. Operative and non operative treatments have been discussed. Risks of surgery include bleeding, infection,  Common bile duct injury,  Injury to the stomach,liver, colon,small intestine, abdominal wall,  Diaphragm,  Major blood vessels,  And the need for an open procedure.  Other risks include worsening of medical problems, death,  DVT and pulmonary embolism, and cardiovascular events.   Medical options have also been discussed. The patient has been informed of long term expectations of surgery and non surgical options,  The patient agrees to proceed.    Pre-operative Diagnosis: Calculus of gallbladder with acute cholecystitis, without mention of obstruction  Post-operative Diagnosis: Same  Surgeon: Shahiem Bedwell A.   Assistants: OR staff  Anesthesia: General endotracheal anesthesia and Local anesthesia 0.25.% bupivacaine, with epinephrine  ASA Class: 1  Procedure Details  The patient was seen again in the Holding Room. The risks, benefits, complications, treatment options, and expected outcomes were discussed with the patient. The possibilities of reaction to medication, pulmonary aspiration, perforation of viscus, bleeding, recurrent infection, finding a normal gallbladder, the need for additional procedures, failure to diagnose a condition, the possible need to convert to an open procedure, and creating a complication requiring transfusion or operation were discussed with the patient. The patient and/or family concurred with the proposed plan, giving informed consent. The site of surgery properly noted/marked. The patient was taken to Operating Room, identified as Jillian Price and the procedure verified as Laparoscopic Cholecystectomy with Intraoperative Cholangiograms. A  Time Out was held and the above information confirmed.  Prior to the induction of general anesthesia, antibiotic prophylaxis was administered. General endotracheal anesthesia was then administered and tolerated well. After the induction, the abdomen was prepped in the usual sterile fashion. The patient was positioned in the supine position with the left arm comfortably tucked, along with some reverse Trendelenburg.  Local anesthetic agent was injected into the skin near the umbilicus and an incision made. The midline fascia was incised and the Hasson technique was used to introduce a 12 mm port under direct vision. It was secured with a figure of eight Vicryl suture placed in the usual fashion. Pneumoperitoneum was then created with CO2 and tolerated well without any adverse changes in the patient's vital signs. Additional trocars were introduced under direct vision with an 11 mm trocar in the epigastrium and two  5 mm trocars in the right upper quadrant. All skin incisions were infiltrated with a local anesthetic agent before making the incision and placing the trocars.   The gallbladder was identified, the fundus grasped and retracted cephalad. Adhesions were lysed bluntly and with the electrocautery where indicated, taking care not to injure any adjacent organs or viscus. The infundibulum was grasped and retracted laterally, exposing the peritoneum overlying the triangle of Calot. This was then divided and exposed in a blunt fashion. The cystic duct was clearly identified and bluntly dissected circumferentially. The junctions of the gallbladder, cystic duct and common bile duct were clearly identified prior to the division of any linear structure.   An incision was made in the cystic duct and the cholangiogram catheter introduced. The catheter was secured using an endoclip. The study showed no stones and good visualization of the distal and proximal biliary tree. There was no emptying into the duodenum  despite multiple runs.  No evidence of stone ,  Stricture,  Dilation or high  pressure.  Will check LFT function in am.   The catheter was then removed.   The cystic duct was then  ligated with surgical clips  on the patient side and  clipped on the gallbladder side and divided. The cystic artery was identified, dissected free, ligated with clips and divided as well. Posterior cystic artery clipped and divided.  The gallbladder was dissected from the liver bed in retrograde fashion with the electrocautery. The gallbladder was removed. The liver bed was irrigated and inspected. Hemostasis was achieved with the electrocautery. Copious irrigation was utilized and was repeatedly aspirated until clear all particulate matter. Hemostasis was achieved with no signs  Of bleeding or bile leakage.  Pneumoperitoneum was completely reduced after viewing removal of the trocars under direct vision. The wound was thoroughly irrigated and the fascia was then closed with a figure of eight suture; the skin was then closed with   and a sterile dressing was applied.  Instrument, sponge, and needle counts were correct at closure and at the conclusion of the case.   Findings: Cholecystitis with Cholelithiasis  Estimated Blood Loss: Minimal         Drains: none         Total IV Fluids: 1000 mL         Specimens: Gallbladder           Complications: None; patient tolerated the procedure well.         Disposition: PACU - hemodynamically stable.         Condition: stable

## 2014-01-22 NOTE — Anesthesia Preprocedure Evaluation (Addendum)
Anesthesia Evaluation  Patient identified by MRN, date of birth, ID band Patient awake    Reviewed: Allergy & Precautions, H&P , NPO status , Patient's Chart, lab work & pertinent test results  Airway Mallampati: II TM Distance: >3 FB Neck ROM: Full    Dental  (+) Teeth Intact, Dental Advisory Given   Pulmonary neg pulmonary ROS,  breath sounds clear to auscultation        Cardiovascular negative cardio ROS  Rhythm:Regular Rate:Normal     Neuro/Psych    GI/Hepatic Neg liver ROS, GI history noted. CE   Endo/Other    Renal/GU negative Renal ROS     Musculoskeletal   Abdominal   Peds  Hematology   Anesthesia Other Findings   Reproductive/Obstetrics                         Anesthesia Physical Anesthesia Plan  ASA: I and emergent  Anesthesia Plan: General   Post-op Pain Management:    Induction: Intravenous, Rapid sequence and Cricoid pressure planned  Airway Management Planned: Oral ETT  Additional Equipment:   Intra-op Plan:   Post-operative Plan: Extubation in OR  Informed Consent: I have reviewed the patients History and Physical, chart, labs and discussed the procedure including the risks, benefits and alternatives for the proposed anesthesia with the patient or authorized representative who has indicated his/her understanding and acceptance.   Dental advisory given  Plan Discussed with: CRNA, Anesthesiologist and Surgeon  Anesthesia Plan Comments:       Anesthesia Quick Evaluation

## 2014-01-23 LAB — COMPREHENSIVE METABOLIC PANEL
ALT: 72 U/L — AB (ref 0–35)
AST: 63 U/L — ABNORMAL HIGH (ref 0–37)
Albumin: 3.3 g/dL — ABNORMAL LOW (ref 3.5–5.2)
Alkaline Phosphatase: 58 U/L (ref 39–117)
Anion gap: 13 (ref 5–15)
BUN: 5 mg/dL — ABNORMAL LOW (ref 6–23)
CALCIUM: 8.9 mg/dL (ref 8.4–10.5)
CO2: 24 mEq/L (ref 19–32)
CREATININE: 0.71 mg/dL (ref 0.50–1.10)
Chloride: 105 mEq/L (ref 96–112)
GFR calc non Af Amer: >90 mL/min (ref >90)
GLUCOSE: 116 mg/dL — AB (ref 70–99)
Potassium: 3.9 mEq/L (ref 3.7–5.3)
SODIUM: 142 meq/L (ref 137–147)
TOTAL PROTEIN: 6.7 g/dL (ref 6.0–8.3)
Total Bilirubin: 0.7 mg/dL (ref 0.3–1.2)

## 2014-01-23 LAB — CBC
HCT: 33.3 % — ABNORMAL LOW (ref 36.0–46.0)
HEMOGLOBIN: 11.2 g/dL — AB (ref 12.0–15.0)
MCH: 25.7 pg — AB (ref 26.0–34.0)
MCHC: 33.6 g/dL (ref 30.0–36.0)
MCV: 76.6 fL — ABNORMAL LOW (ref 78.0–100.0)
Platelets: 276 10*3/uL (ref 150–400)
RBC: 4.35 MIL/uL (ref 3.87–5.11)
RDW: 13.8 % (ref 11.5–15.5)
WBC: 12.3 10*3/uL — ABNORMAL HIGH (ref 4.0–10.5)

## 2014-01-23 MED ORDER — HYDROCODONE-ACETAMINOPHEN 5-325 MG PO TABS: 1.0000 | ORAL_TABLET | Freq: Four times a day (QID) | ORAL | Status: AC | PRN

## 2014-01-23 NOTE — Progress Notes (Signed)
Discharge Note: Reviewed with the patient discharge instructions along with Rx. Pt asked appropriate questions which were answered. Pt reports she feels ready to go home but her ride is currently at church. Pt plans to leave later this afternoon when her ride home is available. Pt doesn't have any further questions at this time.

## 2014-01-23 NOTE — Discharge Summary (Signed)
  Patient ID: Jillian Price [id number] 33 y.o. Jan 08, 1981  Admit date: 01/22/2014  Discharge date and time: No discharge date for patient encounter.  Admitting Physician: Frederik Schmidt, MD  Discharge Physician: Ernestene Mention  Admission Diagnoses: Symptomatic cholelithiasis [574.20]  Discharge Diagnoses: Acute and chronic cholecystitis with cholelithiasis  Operations: Procedure(s): LAPAROSCOPIC CHOLECYSTECTOMY WITH INTRAOPERATIVE CHOLANGIOGRAM  Admission Condition: fair  Discharged Condition: good  Indication for Admission: Patient presented with 24-hour history of abdominal pain, severe right upper quadrant and epigastric pain associated with nausea and vomiting. No history of jaundice or diarrhea. She came to emergency room her ultrasound demonstrated a large single stone obstructing the cystic duct. Surgery was called.  Dr. Lindie Spruce saw  the patient in the early morning hours  and felt that it was most prudent to admit for cholecystectomy.  Hospital Course: The patient was admitted, started on antibiotics and analgesics and IV fluid hydration and bowel rest. Dr. Angelique Blonder saw the patient in the morning and felt that she should proceed with cholecystectomy. The patient was taken to the operating room and underwent laparoscopic cholecystectomy with cholangiogram.    The cholangiogram showed normal intrahepatic and extrahepatic biliary anatomy, no dilatation, and no filling defect the contrast did not empty into the duodenum despite multiple runs. It was felt this was most likely physiologic or due to narcotic effect. The patient had normal liver function tests preop.    The patient was observed overnight and did well. She resume diet, ambulation and voiding. She had minimal pain. Liver function tests on postop day one showed slight elevation of AST and ALT, but alkaline phosphatase and bilirubin were normal. She is felt to be low risk for retained common bile duct stones.  Abdominal  exam on postop day 1 was benign. Abdomen was soft, nondistended his wounds looked good. The patient wanted to go home. She was discharged with a prescription for Norco for pain. Diet and activities were discussed. Follow up in our office in 3 weeks was strongly recommended.  Consults: None  Significant Diagnostic Studies: Lab work, ultrasound, cholangiogram  Treatments: surgery: Laparoscopic cholecystectomy with cholangiogram  Disposition: Home  Patient Instructions:    Medication List         HYDROcodone-acetaminophen 5-325 MG per tablet  Commonly known as:  NORCO  Take 1-2 tablets by mouth every 6 (six) hours as needed for moderate pain or severe pain.        Activity: no sports or heavy lifting for 3 weeks. Return to work in 1-2 days. 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 Diet: low fat, low cholesterol diet Wound Care: none needed  Follow-up:  With [facility] in 3 weeks.  Signed: Angelia Mould. Derrell Lolling, M.D., FACS General and minimally invasive surgery Breast and Colorectal Surgery  01/23/2014, 9:58 AM

## 2014-01-23 NOTE — Discharge Instructions (Signed)
-  see above 

## 2014-01-24 ENCOUNTER — Encounter (HOSPITAL_COMMUNITY): Payer: Self-pay | Admitting: Surgery

## 2015-06-26 IMAGING — US US ABDOMEN LIMITED
1 series · 14 of 25 positions shown · non-contrast
Comparison: None.

CLINICAL DATA: RIGHT upper quadrant pain.

EXAM:
US ABDOMEN LIMITED - RIGHT UPPER QUADRANT

[Series 1: us abdomen limited · 0.26mm/px · 14 of 27 slices shown]
[im 1/27]
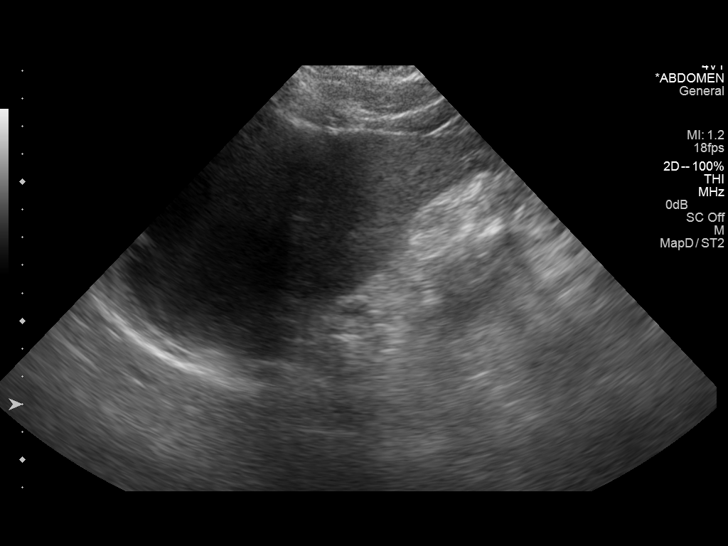
[im 3/27]
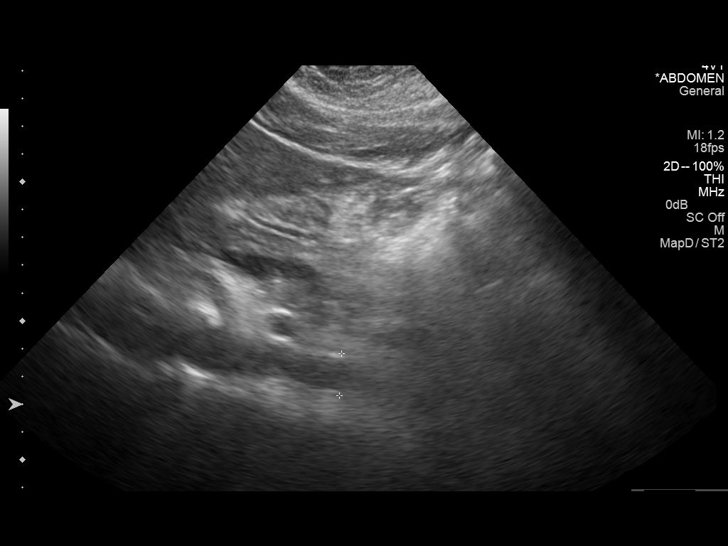
[im 5/27]
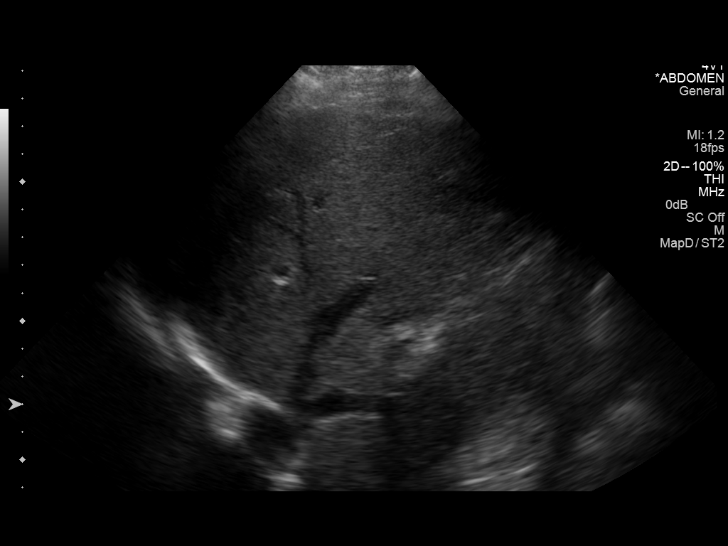
[im 7/27]
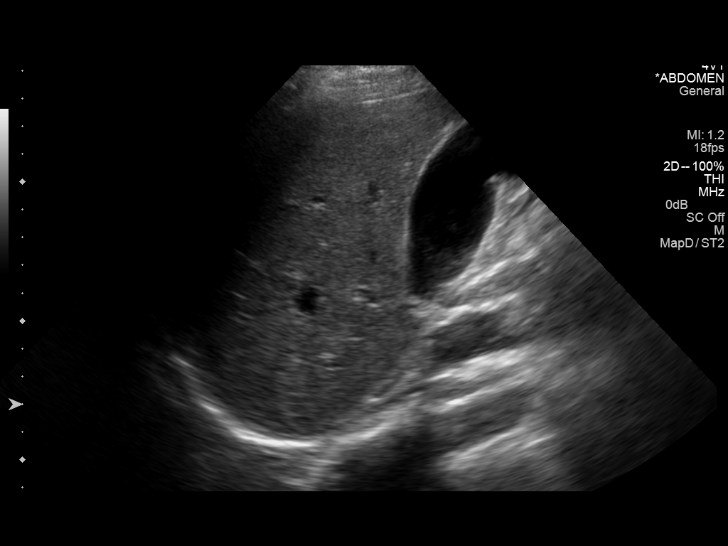
[im 9/27]
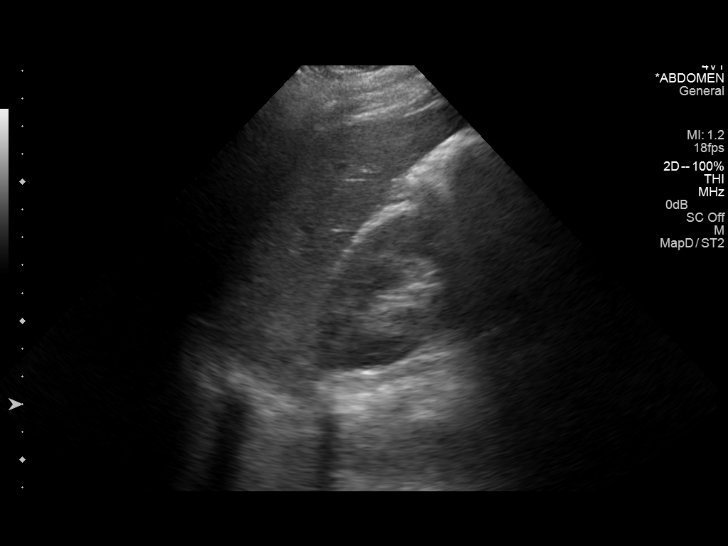
[im 10/27]
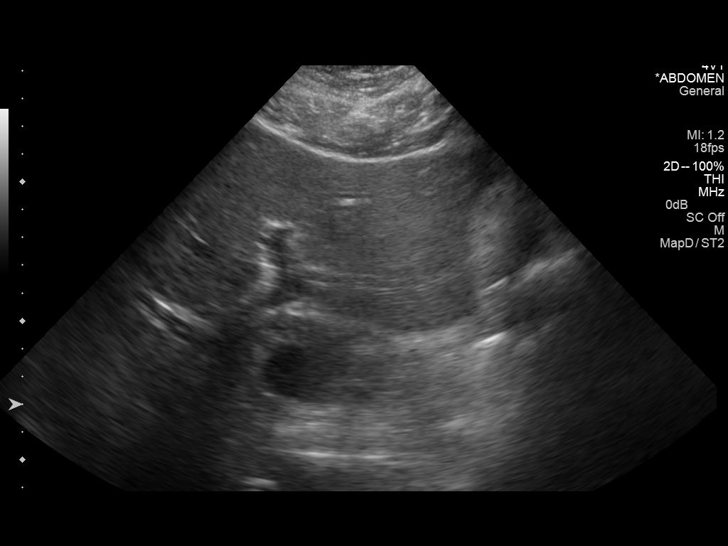
[im 12/27]
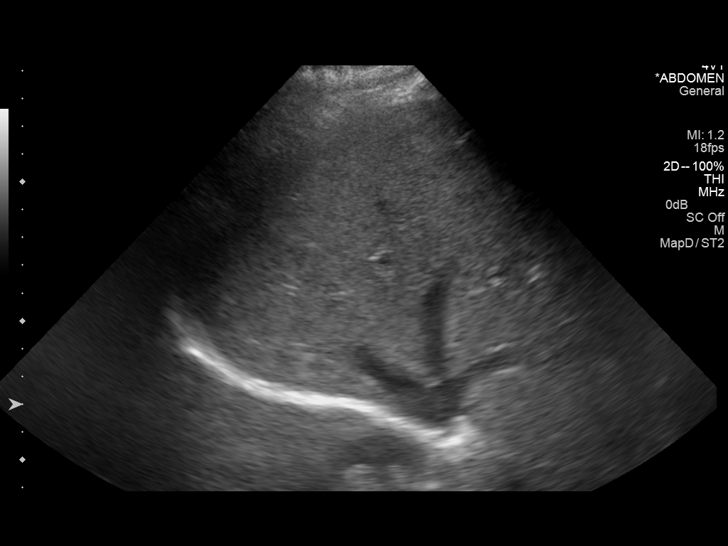
[im 15/27]
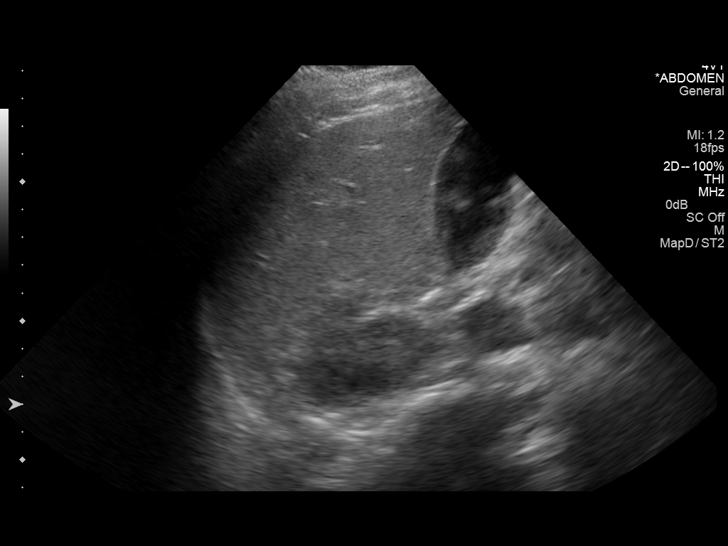
[im 17/27]
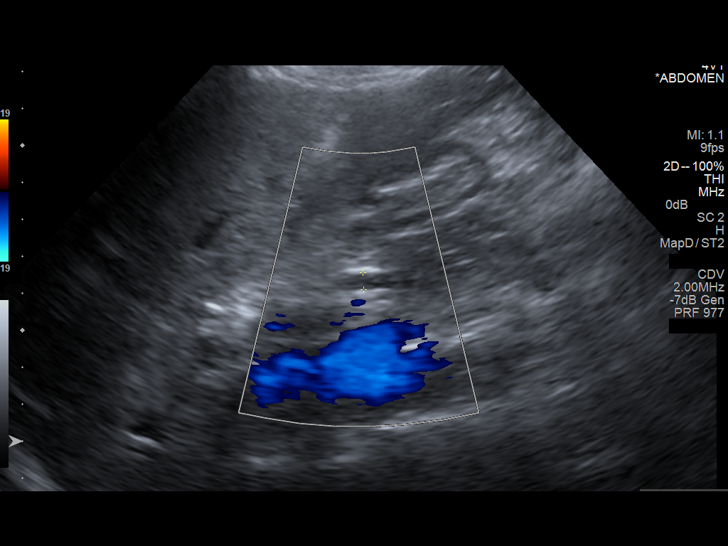
[im 18/27]
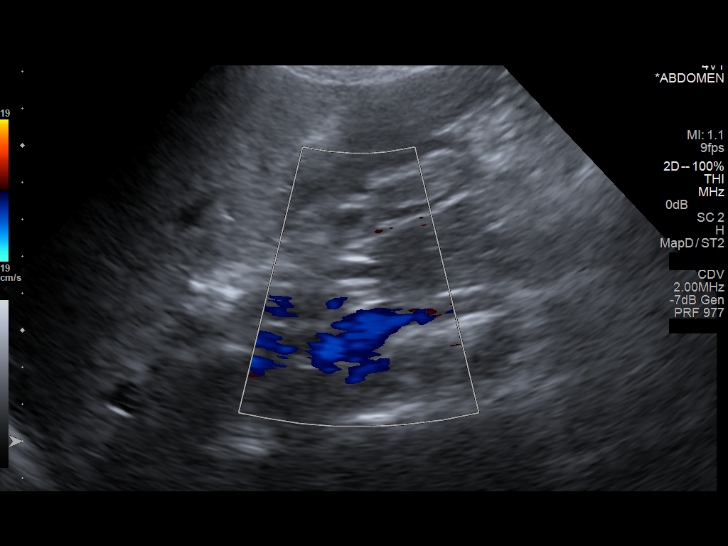
[im 20/27]
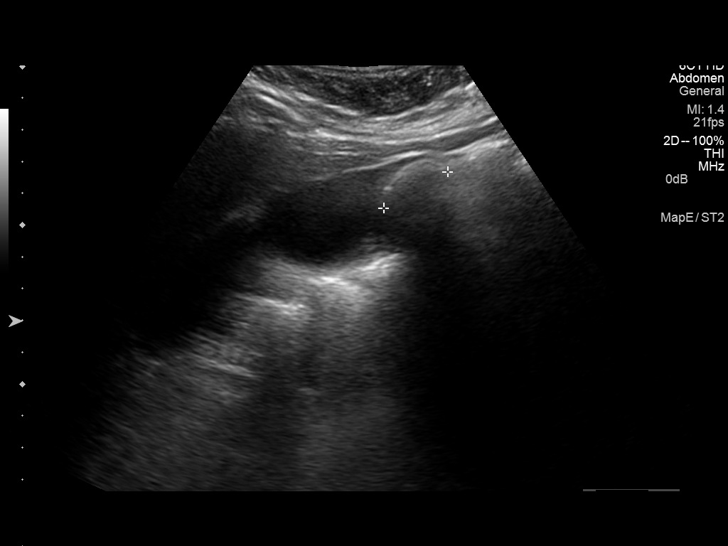
[im 22/27]
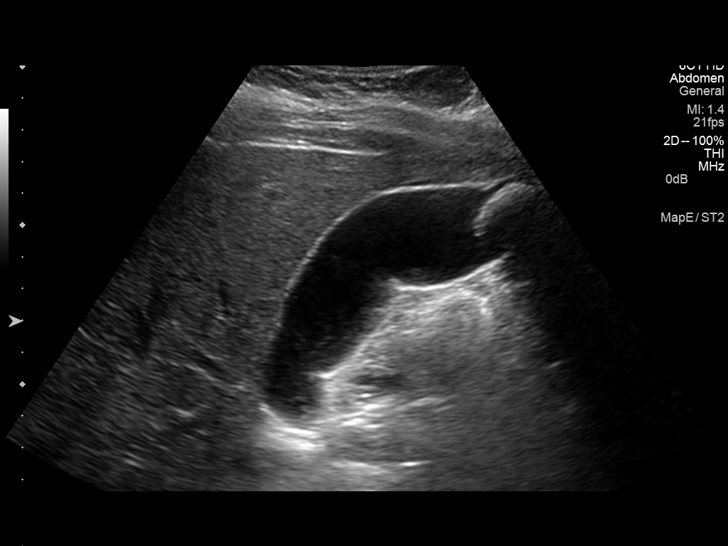
[im 24/27]
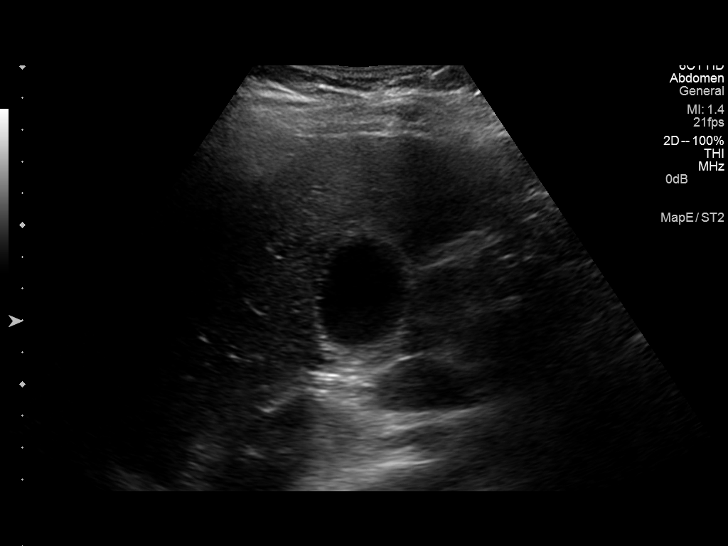
[im 27/27]
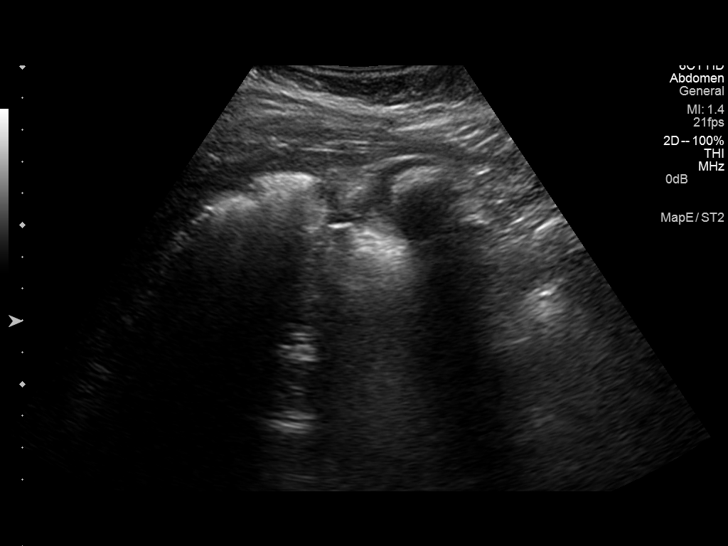

[14 of 25 positions shown; findings below may reference images not displayed]

FINDINGS: Gallbladder:

Multiple tiny echogenic gallstones versus sludge with solitary
cm gallstone with acoustic shadowing . Mild gallbladder distention
without wall thickening or pericholecystic fluid. Reported
sonographic Murphy's sign was elicited.

Common bile duct:

Diameter: 4 mm

Liver:

No focal lesion identified. Within normal limits in parenchymal
echogenicity.
IMPRESSION: Cholelithiasis/sludge and reported sonographic Murphy's sign without
corroborative findings of acute cholecystitis.

  By: Chai Tiger

## 2015-06-26 IMAGING — RF DG CHOLANGIOGRAM OPERATIVE
1 series · 12 of 12 positions shown · non-contrast
Comparison: Abdominal ultrasound earlier same date.

CLINICAL DATA: Cholelithiasis. Acute cholecystitis. Laparoscopic
cholecystectomy.

EXAM:
INTRAOPERATIVE CHOLANGIOGRAM
TECHNIQUE: Cholangiographic images from the C-arm fluoroscopic device were
submitted for interpretation post-operatively. Please see the
procedural report for the amount of contrast and the fluoroscopy
time utilized.

[Series 1: run · 3 acquisitions, 12 frames shown]
[im 1/3]
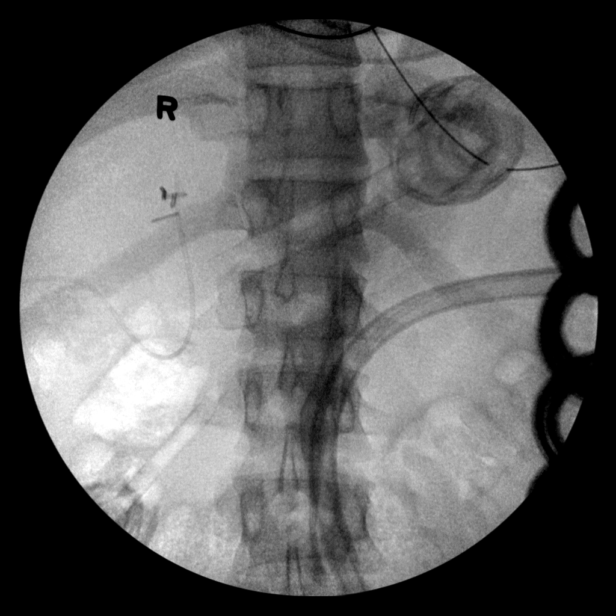
[im 1/3]
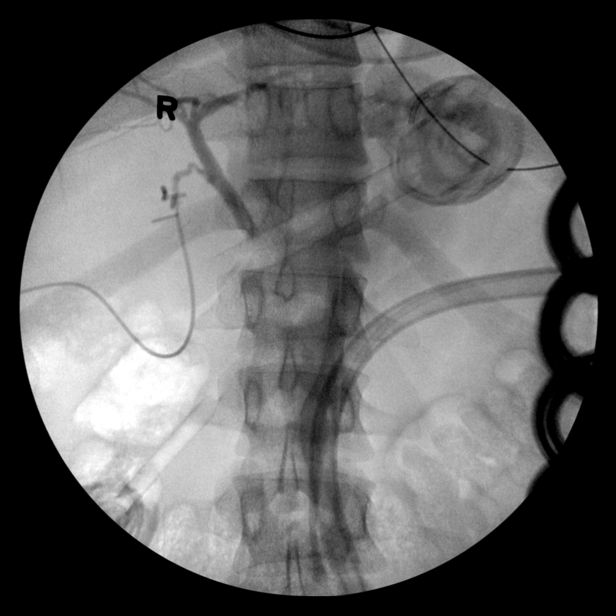
[im 1/3]
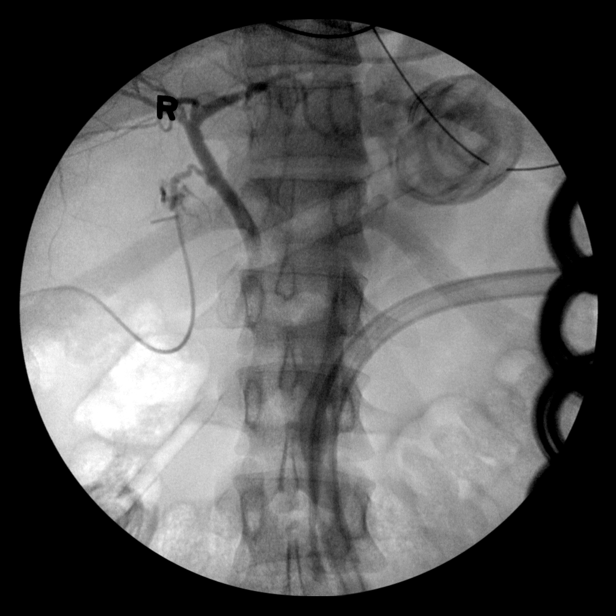
[im 1/3]
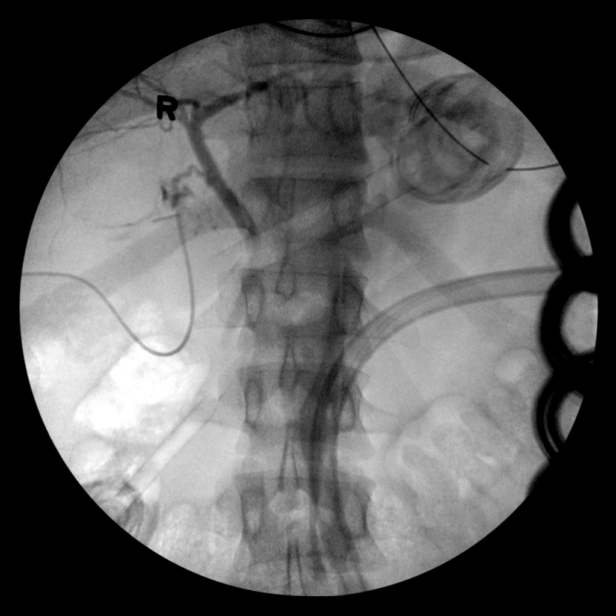
[im 2/3]
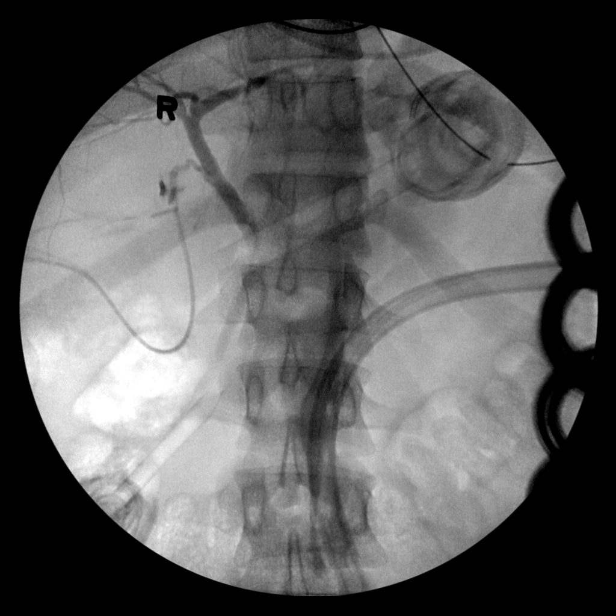
[im 2/3]
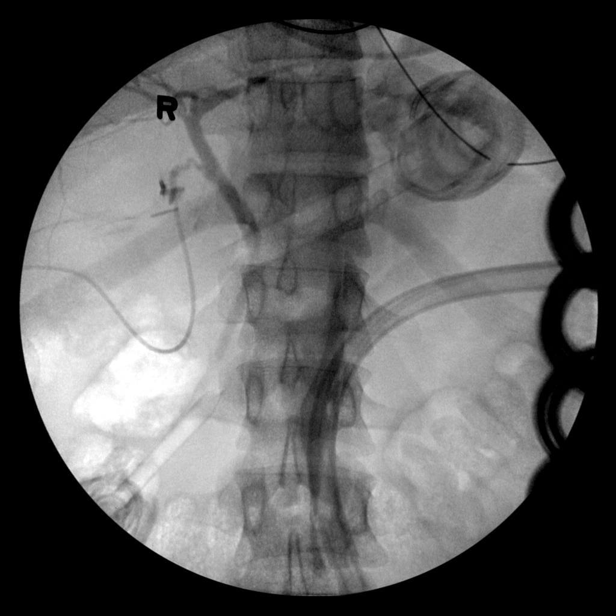
[im 2/3]
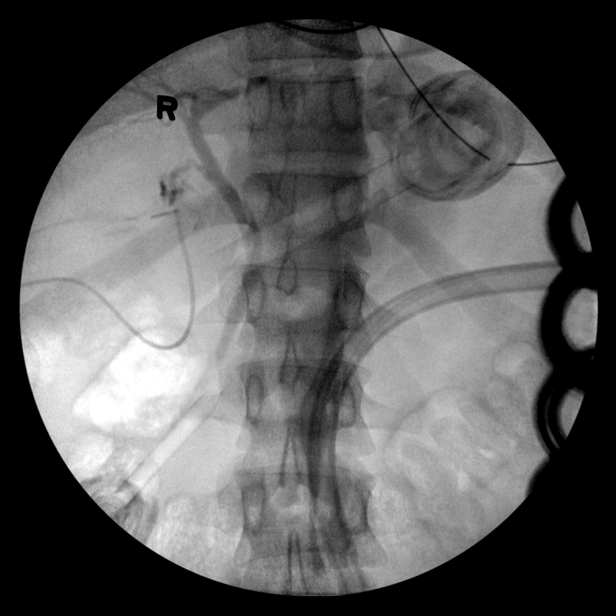
[im 2/3]
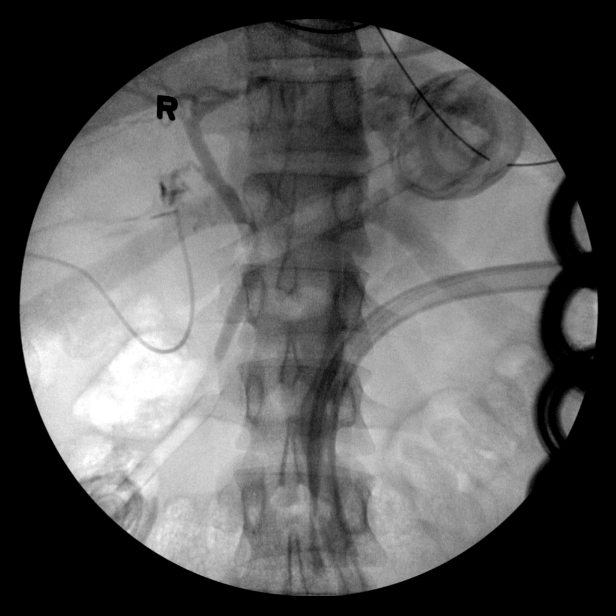
[im 3/3]
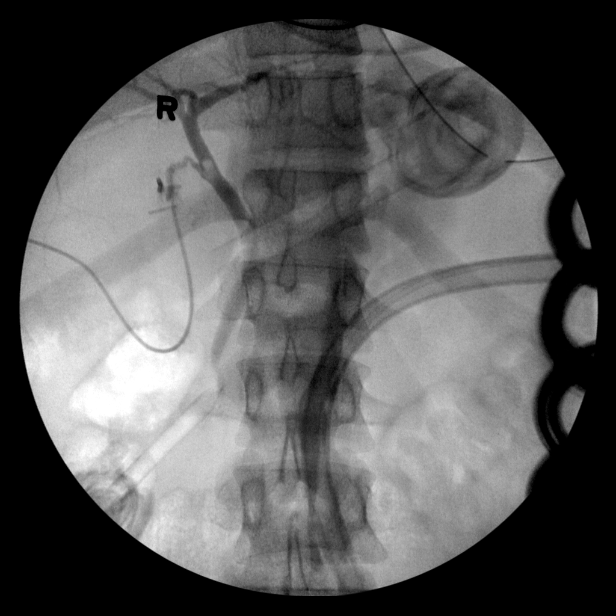
[im 3/3]
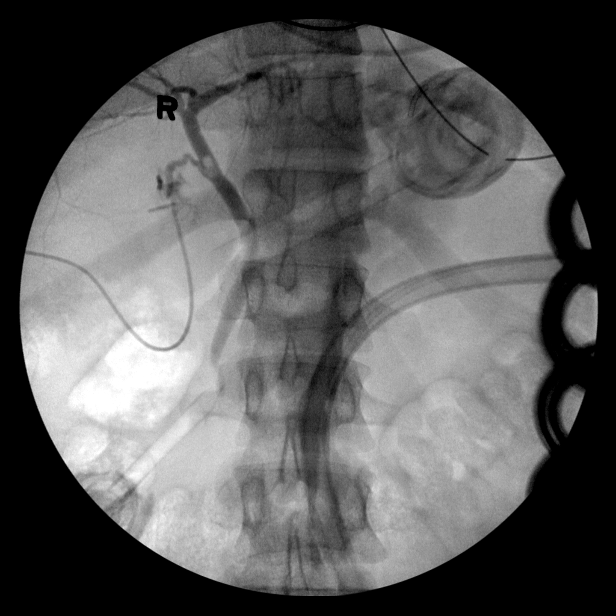
[im 3/3]
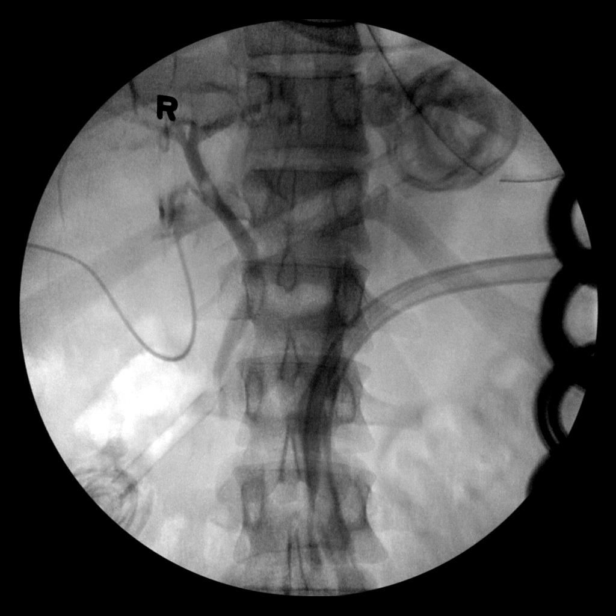
[im 3/3]
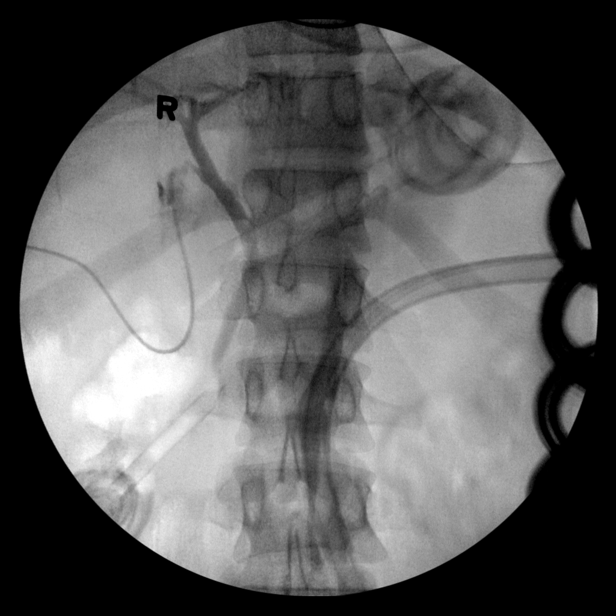

[12 of 12 positions shown; findings below may reference images not displayed]

FINDINGS: Three series of coal angiographic images were submitted for
interpretation post procedure. A filling defect was noted on the
final series, not visible on the earlier series, which moved into
the independent portion of the biliary tree in the liver, consistent
with an air bubble. No persistent filling defects to suggest
retained gallstones. All none of the series dated contrast ever
enter the duodenum, but there is no biliary ductal dilation.

The radiologic technologist documented 24 sec of fluoroscopy time.
IMPRESSION: No retained bile duct stones. Non filling of the duodenum may be
related to spasm at the sphincter of Oddi. Lack of biliary ductal
dilation makes obstruction much less likely.

## 2022-05-01 ENCOUNTER — Other Ambulatory Visit: Payer: Self-pay | Admitting: Obstetrics and Gynecology

## 2022-05-01 DIAGNOSIS — N6310 Unspecified lump in the right breast, unspecified quadrant: Secondary | ICD-10-CM

## 2022-05-07 ENCOUNTER — Other Ambulatory Visit: Payer: Self-pay | Admitting: Obstetrics and Gynecology

## 2022-05-07 DIAGNOSIS — R928 Other abnormal and inconclusive findings on diagnostic imaging of breast: Secondary | ICD-10-CM

## 2022-05-10 ENCOUNTER — Other Ambulatory Visit: Payer: Self-pay

## 2022-05-17 ENCOUNTER — Other Ambulatory Visit: Payer: Self-pay

## 2022-05-28 ENCOUNTER — Ambulatory Visit
Admission: RE | Admit: 2022-05-28 | Discharge: 2022-05-28 | Disposition: A | Discharge status: Home / Self Care | Payer: BLUE CROSS/BLUE SHIELD | Source: Ambulatory Visit | Attending: Obstetrics and Gynecology | Admitting: Obstetrics and Gynecology

## 2022-05-28 DIAGNOSIS — R928 Other abnormal and inconclusive findings on diagnostic imaging of breast: Secondary | ICD-10-CM

## 2022-12-02 ENCOUNTER — Other Ambulatory Visit: Payer: Self-pay | Admitting: Obstetrics and Gynecology

## 2022-12-02 DIAGNOSIS — N6310 Unspecified lump in the right breast, unspecified quadrant: Secondary | ICD-10-CM

## 2022-12-12 ENCOUNTER — Other Ambulatory Visit: Payer: BLUE CROSS/BLUE SHIELD

## 2022-12-23 ENCOUNTER — Ambulatory Visit
Admission: RE | Admit: 2022-12-23 | Discharge: 2022-12-23 | Disposition: A | Discharge status: Home / Self Care | Payer: 59 | Source: Ambulatory Visit | Attending: Obstetrics and Gynecology | Admitting: Obstetrics and Gynecology

## 2022-12-23 ENCOUNTER — Other Ambulatory Visit: Payer: Self-pay | Admitting: Obstetrics and Gynecology

## 2022-12-23 DIAGNOSIS — N6310 Unspecified lump in the right breast, unspecified quadrant: Secondary | ICD-10-CM

## 2023-05-23 ENCOUNTER — Other Ambulatory Visit: Payer: BLUE CROSS/BLUE SHIELD

## 2023-06-12 ENCOUNTER — Ambulatory Visit
Admission: RE | Admit: 2023-06-12 | Discharge: 2023-06-12 | Disposition: A | Discharge status: Home / Self Care | Payer: BLUE CROSS/BLUE SHIELD | Source: Ambulatory Visit | Attending: Obstetrics and Gynecology | Admitting: Obstetrics and Gynecology

## 2023-06-12 ENCOUNTER — Ambulatory Visit
Admission: RE | Admit: 2023-06-12 | Discharge: 2023-06-12 | Disposition: A | Discharge status: Home / Self Care | Payer: 59 | Source: Ambulatory Visit | Attending: Obstetrics and Gynecology | Admitting: Obstetrics and Gynecology

## 2023-06-12 DIAGNOSIS — N6310 Unspecified lump in the right breast, unspecified quadrant: Secondary | ICD-10-CM

## 2023-07-22 ENCOUNTER — Other Ambulatory Visit: Payer: Self-pay | Admitting: Student

## 2023-07-22 DIAGNOSIS — N979 Female infertility, unspecified: Secondary | ICD-10-CM

## 2023-08-24 ENCOUNTER — Inpatient Hospital Stay: Admission: RE | Admit: 2023-08-24 | Source: Ambulatory Visit
